# Patient Record
Sex: Male | Born: 2000
Health system: Southern US, Community
[De-identification: ages and names within clinical notes are randomized; demographics above are authoritative.]

---
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
## 2019-06-06 ENCOUNTER — Telehealth: Payer: Self-pay | Admitting: Physician Assistant

## 2019-06-06 ENCOUNTER — Other Ambulatory Visit: Payer: Self-pay

## 2019-06-06 DIAGNOSIS — J069 Acute upper respiratory infection, unspecified: Secondary | ICD-10-CM

## 2019-06-06 DIAGNOSIS — Z20822 Contact with and (suspected) exposure to covid-19: Secondary | ICD-10-CM

## 2019-06-06 MED ORDER — BENZONATATE 100 MG PO CAPS: 100.0000 mg | ORAL_CAPSULE | Freq: Three times a day (TID) | ORAL | 0 refills | Status: DC | PRN

## 2019-06-06 MED ORDER — ALBUTEROL SULFATE HFA 108 (90 BASE) MCG/ACT IN AERS: 2.0000 | INHALATION_SPRAY | Freq: Four times a day (QID) | RESPIRATORY_TRACT | 0 refills | Status: DC | PRN

## 2019-06-06 NOTE — Progress Notes (Signed)
E-Visit for Corona Virus Screening   Your current symptoms could be consistent with the coronavirus.  Many health care providers can now test patients at their office but not all are.  [facility] has multiple testing sites. For information on our COVID testing locations and hours go to [email or web address]  Please quarantine yourself while awaiting your test results.  We are enrolling you in our MyChart Home Mont[facility] for COVID19 . Daily you will receive a questionnaire within the MyChart website. Our COVID 19 response team willl be monitoriing your responses daily. Please continue good preventive care measures, including:  frequent hand-washing, avoid touching your face, cover coughs/sneezes, stay out of crowds and keep a 6 foot distance from others.    COVID-19 is a respiratory illness with symptoms that are similar to the flu. Symptoms are typically mild to moderate, but there have been cases of severe illness and death due to the virus. The following symptoms may appear 2-14 days after exposure: . Fever . Cough . Shortness of breath or difficulty breathing . Chills . Repeated shaking with chills . Muscle pain . Headache . Sore throat . New loss of taste or smell . Fatigue . Congestion or runny nose . Nausea or vomiting . Diarrhea  If you develop fever/cough/breathlessness, please stay home for 10 days with improving symptoms and until you have had 24 hours of no fever (without taking a fever reducer).  Go to the nearest hospital ED for assessment if fever/cough/breathlessness are severe or illness seems like a threat to life.  It is vitally important that if you feel that you have an infection such as this virus or any other virus that you stay home and away from places where you may spread it to others.  You should avoid contact with people age 65 and older.   You should wear a mask or cloth face covering over your nose and mouth if you must be around other  people or animals, including pets (even at home). Try to stay at least 6 feet away from other people. This will protect the people around you.  You can use medication such as A prescription cough medication called Tessalon Perles 100 mg. You may take 1-2 capsules every 8 hours as needed for cough and A prescription inhaler called Albuterol MDI 90 mcg /actuation 2 puffs every 4 hours as needed for shortness of breath, wheezing, cough  You may also take acetaminophen (Tylenol) as needed for fever.   Reduce your risk of any infection by using the same precautions used for avoiding the common cold or flu:  . Wash your hands often with soap and warm water for at least 20 seconds.  If soap and water are not readily available, use an alcohol-based hand sanitizer with at least 60% alcohol.  . If coughing or sneezing, cover your mouth and nose by coughing or sneezing into the elbow areas of your shirt or coat, into a tissue or into your sleeve (not your hands). . Avoid shaking hands with others and consider head nods or verbal greetings only. . Avoid touching your eyes, nose, or mouth with unwashed hands.  . Avoid close contact with people who are sick. . Avoid places or events with large numbers of people in one location, like concerts or sporting events. . Carefully consider travel plans you have or are making. . If you are planning any travel outside or inside the [place], visit the CDC's Travelers' Health webpage for the latest health notices. . If   you have some symptoms but not all symptoms, continue to monitor at home and seek medical attention if your symptoms worsen. . If you are having a medical emergency, call 911.  HOME CARE . Only take medications as instructed by your medical team. . Drink plenty of fluids and get plenty of rest. . A steam or ultrasonic humidifier can help if you have congestion.   GET HELP RIGHT AWAY IF YOU HAVE EMERGENCY WARNING SIGNS** FOR COVID-19. If you or someone is  showing any of these signs seek emergency medical care immediately. Call 911 or proceed to your closest emergency facility if: . You develop worsening high fever. . Trouble breathing . Bluish lips or face . Persistent pain or pressure in the chest . New confusion . Inability to wake or stay awake . You cough up blood. . Your symptoms become more severe  **This list is not all possible symptoms. Contact your medical provider for any symptoms that are sever or concerning to you.   MAKE SURE YOU   Understand these instructions.  Will watch your condition.  Will get help right away if you are not doing well or get worse.  Your e-visit answers were reviewed by a board certified advanced clinical practitioner to complete your personal care plan.  Depending on the condition, your plan could have included both over the counter or prescription medications.  If there is a problem please reply once you have received a response from your provider.  Your safety is important to us.  If you have drug allergies check your prescription carefully.    You can use MyChart to ask questions about today's visit, request a non-urgent call back, or ask for a work or school excuse for 24 hours related to this e-Visit. If it has been greater than 24 hours you will need to follow up with your provider, or enter a new e-Visit to address those concerns. You will get an e-mail in the next two days asking about your experience.  I hope that your e-visit has been valuable and will speed your recovery. Thank you for using e-visits.  Jonathon Bultman PA-C  Approximately 5 minutes was spent documenting and reviewing patient's chart.    

## 2019-06-06 NOTE — Addendum Note (Signed)
Addended by: Terald Sleeper on: 06/06/2019 11:38 AM   Modules accepted: Orders

## 2019-06-08 LAB — NOVEL CORONAVIRUS, NAA: SARS-CoV-2, NAA: NOT DETECTED

## 2019-06-10 ENCOUNTER — Emergency Department (HOSPITAL_COMMUNITY): Payer: 59

## 2019-06-10 ENCOUNTER — Other Ambulatory Visit: Payer: Self-pay

## 2019-06-10 ENCOUNTER — Emergency Department (HOSPITAL_COMMUNITY)
Admission: EM | Admit: 2019-06-10 | Discharge: 2019-06-10 | Disposition: A | Discharge status: Home / Self Care | Payer: 59 | Attending: Emergency Medicine | Admitting: Emergency Medicine

## 2019-06-10 ENCOUNTER — Encounter (HOSPITAL_COMMUNITY): Payer: Self-pay | Admitting: Emergency Medicine

## 2019-06-10 DIAGNOSIS — K529 Noninfective gastroenteritis and colitis, unspecified: Secondary | ICD-10-CM | POA: Insufficient documentation

## 2019-06-10 DIAGNOSIS — R1084 Generalized abdominal pain: Secondary | ICD-10-CM | POA: Diagnosis present

## 2019-06-10 LAB — COMPREHENSIVE METABOLIC PANEL
ALT: 32 U/L (ref 0–44)
AST: 18 U/L (ref 15–41)
Albumin: 4.3 g/dL (ref 3.5–5.0)
Alkaline Phosphatase: 112 U/L (ref 38–126)
Anion gap: 14 (ref 5–15)
BUN: 13 mg/dL (ref 6–20)
CO2: 18 mmol/L — ABNORMAL LOW (ref 22–32)
Calcium: 9.3 mg/dL (ref 8.9–10.3)
Chloride: 105 mmol/L (ref 98–111)
Creatinine, Ser: 1.19 mg/dL (ref 0.61–1.24)
GFR calc Af Amer: >60 mL/min (ref >60)
GFR calc non Af Amer: >60 mL/min (ref >60)
Glucose, Bld: 227 mg/dL — ABNORMAL HIGH (ref 70–99)
Potassium: 3.5 mmol/L (ref 3.5–5.1)
Sodium: 137 mmol/L (ref 135–145)
Total Bilirubin: 1.1 mg/dL (ref 0.3–1.2)
Total Protein: 8 g/dL (ref 6.5–8.1)

## 2019-06-10 LAB — URINALYSIS, ROUTINE W REFLEX MICROSCOPIC
Bilirubin Urine: NEGATIVE
Glucose, UA: 50 mg/dL — AB
Hgb urine dipstick: NEGATIVE
Ketones, ur: NEGATIVE mg/dL
Leukocytes,Ua: NEGATIVE
Nitrite: NEGATIVE
Protein, ur: 100 mg/dL — AB
Specific Gravity, Urine: 1.026 (ref 1.005–1.030)
pH: 5 (ref 5.0–8.0)

## 2019-06-10 LAB — CBC WITH DIFFERENTIAL/PLATELET
Abs Immature Granulocytes: 0.04 10*3/uL (ref 0.00–0.07)
Basophils Absolute: 0.1 10*3/uL (ref 0.0–0.1)
Basophils Relative: 1 %
Eosinophils Absolute: 0.2 10*3/uL (ref 0.0–0.5)
Eosinophils Relative: 3 %
HCT: 54.2 % — ABNORMAL HIGH (ref 39.0–52.0)
Hemoglobin: 18.4 g/dL — ABNORMAL HIGH (ref 13.0–17.0)
Immature Granulocytes: 1 %
Lymphocytes Relative: 20 %
Lymphs Abs: 1.6 10*3/uL (ref 0.7–4.0)
MCH: 30 pg (ref 26.0–34.0)
MCHC: 33.9 g/dL (ref 30.0–36.0)
MCV: 88.4 fL (ref 80.0–100.0)
Monocytes Absolute: 0.6 10*3/uL (ref 0.1–1.0)
Monocytes Relative: 7 %
Neutro Abs: 5.5 10*3/uL (ref 1.7–7.7)
Neutrophils Relative %: 68 %
Platelets: 350 10*3/uL (ref 150–400)
RBC: 6.13 MIL/uL — ABNORMAL HIGH (ref 4.22–5.81)
RDW: 12.9 % (ref 11.5–15.5)
WBC: 7.9 10*3/uL (ref 4.0–10.5)
nRBC: 0 % (ref 0.0–0.2)

## 2019-06-10 LAB — LIPASE, BLOOD: Lipase: 18 U/L (ref 11–51)

## 2019-06-10 MED ORDER — IOHEXOL 300 MG/ML  SOLN
100.0000 mL | Freq: Once | INTRAMUSCULAR | Status: AC | PRN
  Administered 2019-06-10: 100 mL via INTRAVENOUS

## 2019-06-10 MED ORDER — SODIUM CHLORIDE 0.9 % IV BOLUS
1000.0000 mL | Freq: Once | INTRAVENOUS | Status: AC
Start: 2019-06-10 — End: 2019-06-10
  Administered 2019-06-10: 1000 mL via INTRAVENOUS

## 2019-06-10 MED ORDER — SODIUM CHLORIDE 0.9 % IV BOLUS
1000.0000 mL | Freq: Once | INTRAVENOUS | Status: AC
  Administered 2019-06-10: 09:00:00 1000 mL via INTRAVENOUS

## 2019-06-10 MED ORDER — PROMETHAZINE HCL 25 MG PO TABS: 25.0000 mg | ORAL_TABLET | Freq: Four times a day (QID) | ORAL | 0 refills | Status: DC | PRN

## 2019-06-10 MED ORDER — PROMETHAZINE HCL 25 MG/ML IJ SOLN
25.0000 mg | Freq: Once | INTRAMUSCULAR | Status: AC
  Administered 2019-06-10: 25 mg via INTRAVENOUS
  Filled 2019-06-10: qty 1

## 2019-06-10 NOTE — ED Provider Notes (Signed)
[facility] EMERGENCY DEPARTMENT Provider Note   CSN: [id number] Arrival date & time: 06/10/19  0759     History Chief Complaint  Patient presents with  . Abdominal Pain    Jonathon Hughes is a 18 y.o. male.  HPI Patient presents to the emergency department with generalized abdominal pain that started this past Wednesday.  Patient states that it started slowly and he started vomiting with diarrhea.  Patient states that nothing seems to make the condition better or worse.  The patient states he does smoke marijuana on a fairly consistent basis.  The patient states that he did not take any medications prior to arrival for symptoms.  Patient states that he seemed to be doing fairly well until yesterday where he is able to not eat after that point.  Patient states he has had no other upper respiratory type symptoms.  The patient denies chest pain, shortness of breath, headache,blurred vision, neck pain, fever, cough, weakness, numbness, dizziness, anorexia, edema,  rash, back pain, dysuria, hematemesis, bloody stool, near syncope, or syncope.    History reviewed. No pertinent past medical history.  There are no problems to display for this patient.       No family history on file.  Social History   Tobacco Use  . Smoking status: Not on file  Substance Use Topics  . Alcohol use: Not on file  . Drug use: Not on file    Home Medications Prior to Admission medications   Medication Sig Start Date End Date Taking? Authorizing Provider  albuterol (VENTOLIN HFA) 108 (90 Base) MCG/ACT inhaler Inhale 2 puffs into the lungs every 6 (six) hours as needed for wheezing or shortness of breath. 06/06/19   Remus Loffler, PA-C  benzonatate (TESSALON) 100 MG capsule Take 1-2 capsules (100-200 mg total) by mouth 3 (three) times daily as needed for cough. 06/06/19   Remus Loffler, PA-C    Allergies    Patient has no allergy information on record.  Review of Systems    Review of Systems All other systems negative except as documented in the HPI. All pertinent positives and negatives as reviewed in the HPI. Physical Exam Updated Vital Signs BP 112/74   Pulse (!) 106   Temp 98 F (36.7 C) (Oral)   Resp 19   Ht 6' (1.829 m)   SpO2 97%   Physical Exam Vitals and nursing note reviewed.  Constitutional:      General: He is not in acute distress.    Appearance: He is well-developed.  HENT:     Head: Normocephalic and atraumatic.  Eyes:     Pupils: Pupils are equal, round, and reactive to light.  Cardiovascular:     Rate and Rhythm: Normal rate and regular rhythm.     Heart sounds: Normal heart sounds. No murmur. No friction rub. No gallop.   Pulmonary:     Effort: Pulmonary effort is normal. No respiratory distress.     Breath sounds: Normal breath sounds. No wheezing.  Abdominal:     General: Bowel sounds are normal. There is no distension.     Palpations: Abdomen is soft.     Tenderness: There is generalized abdominal tenderness. There is no guarding or rebound.  Musculoskeletal:     Cervical back: Normal range of motion and neck supple.  Skin:    General: Skin is warm and dry.     Capillary Refill: Capillary refill takes less than 2 seconds.  Findings: No erythema or rash.  Neurological:     Mental Status: He is alert and oriented to person, place, and time.     Motor: No abnormal muscle tone.     Coordination: Coordination normal.  Psychiatric:        Behavior: Behavior normal.     ED Results / Procedures / Treatments   Labs (all labs ordered are listed, but only abnormal results are displayed) Labs Reviewed  COMPREHENSIVE METABOLIC PANEL - Abnormal; Notable for the following components:      Result Value   CO2 18 (*)    Glucose, Bld 227 (*)    All other components within normal limits  CBC WITH DIFFERENTIAL/PLATELET - Abnormal; Notable for the following components:   RBC 6.13 (*)    Hemoglobin 18.4 (*)    HCT 54.2 (*)     All other components within normal limits  LIPASE, BLOOD  URINALYSIS, ROUTINE W REFLEX MICROSCOPIC    EKG None  Radiology No results found.  Procedures Procedures (including critical care time)  Medications Ordered in ED Medications  sodium chloride 0.9 % bolus 1,000 mL (1,000 mLs Intravenous New Bag/Given 06/10/19 0848)  promethazine (PHENERGAN) injection 25 mg (25 mg Intravenous Given 06/10/19 0850)    ED Course  I have reviewed the triage vital signs and the nursing notes.  Pertinent labs & imaging results that were available during my care of the patient were reviewed by me and considered in my medical decision making (see chart for details).    MDM Rules/Calculators/A&P     CHA2DS2/VAS Stroke Risk Points      N/A >= 2 Points: High Risk  1 - 1.99 Points: Medium Risk  0 Points: Low Risk    A final score could not be computed because of missing components.: Last  Change: N/A     This score determines the patient's risk of having a stroke if the  patient has atrial fibrillation.      This score is not applicable to this patient. Components are not  calculated.                  I have advised the patient of her plan to obtain laboratory testing, give him IV fluids, and a CT scan of his abdomen pelvis.  Advised the patient that it seems likely that this is just a gastroenteritis.  Patient CT scan does not yield any significant abnormalities.  Patient could have this as a result of his marijuana smoking.  I did advise him that marijuana smoking in excess can cause similar symptoms.  Patient will be discharged home with antiemetics.  Patient's blood sugar was elevated.  Patient is advised on need to follow-up with a primary care doctor and advised to return here as needed. Final Clinical Impression(s) / ED Diagnoses Final diagnoses:  None    Rx / DC Orders ED Discharge Orders    None       Dalia Heading, PA-C 06/10/19 1449    Charlesetta Shanks, MD 06/11/19  1049

## 2019-06-10 NOTE — Discharge Instructions (Signed)
Follow-up with your primary doctor.  Your CT scan did not show any significant abnormality.  You may want to curtail some of your marijuana use as this can lead to nausea vomiting and diarrhea as well if used excessively.  Slowly increase your fluid intake.

## 2019-06-10 NOTE — ED Triage Notes (Addendum)
-  Pt here with reports of generalized abdominal pain with nausea and vomiting since Wednesday -vomited about 4 times in the 24hours -was able to eat soups up until yesterday; he has not been able to eat -denies any home remedies -endorse the use of marijuana (last use yesterday) -denies fever  Addendum:  -Also reports having diarrhea  -nobody else in his home has these sympotoms

## 2021-06-03 ENCOUNTER — Ambulatory Visit (HOSPITAL_COMMUNITY)
Admission: EM | Admit: 2021-06-03 | Discharge: 2021-06-03 | Disposition: A | Discharge status: Home / Self Care | Payer: BC Managed Care – PPO | Attending: Physician Assistant | Admitting: Physician Assistant

## 2021-06-03 ENCOUNTER — Encounter (HOSPITAL_COMMUNITY): Payer: Self-pay

## 2021-06-03 ENCOUNTER — Other Ambulatory Visit: Payer: Self-pay

## 2021-06-03 DIAGNOSIS — J101 Influenza due to other identified influenza virus with other respiratory manifestations: Secondary | ICD-10-CM | POA: Diagnosis not present

## 2021-06-03 DIAGNOSIS — R112 Nausea with vomiting, unspecified: Secondary | ICD-10-CM

## 2021-06-03 DIAGNOSIS — R051 Acute cough: Secondary | ICD-10-CM

## 2021-06-03 LAB — POC INFLUENZA A AND B ANTIGEN (URGENT CARE ONLY)
INFLUENZA A ANTIGEN, POC: POSITIVE — AB
INFLUENZA B ANTIGEN, POC: NEGATIVE

## 2021-06-03 MED ORDER — PROMETHAZINE-DM 6.25-15 MG/5ML PO SYRP: 5.0000 mL | ORAL_SOLUTION | Freq: Three times a day (TID) | ORAL | 0 refills | Status: DC | PRN

## 2021-06-03 MED ORDER — ONDANSETRON 4 MG PO TBDP: 4.0000 mg | ORAL_TABLET | Freq: Three times a day (TID) | ORAL | 0 refills | Status: DC | PRN

## 2021-06-03 MED ORDER — OSELTAMIVIR PHOSPHATE 75 MG PO CAPS: 75.0000 mg | ORAL_CAPSULE | Freq: Two times a day (BID) | ORAL | 0 refills | Status: DC

## 2021-06-03 MED ORDER — ONDANSETRON 4 MG PO TBDP
ORAL_TABLET | ORAL | Status: AC
  Filled 2021-06-03: qty 1

## 2021-06-03 MED ORDER — ONDANSETRON 4 MG PO TBDP
4.0000 mg | ORAL_TABLET | Freq: Once | ORAL | Status: AC
Start: 2021-06-03 — End: 2021-06-03
  Administered 2021-06-03: 4 mg via ORAL

## 2021-06-03 NOTE — ED Provider Notes (Signed)
[facility]    CSN: [id number] Arrival date & time: 06/03/21  5053      History   Chief Complaint Chief Complaint  Patient presents with   Cough    HPI Jonathon Hughes is a 20 y.o. male.   Patient presents today with a 2-day history of URI symptoms.  Reports cough, nasal congestion, nausea, vomiting, fatigue, malaise, body aches.  Denies any chest pain, shortness of breath, abdominal pain, diarrhea.  He has tried Tylenol and NyQuil without improvement of symptoms.  Denies any known sick contacts.  He has not had influenza or COVID-19 vaccine.  He has had COVID approximately 1 year ago.  Denies any significant past medical history other than childhood asthma; has not required albuterol inhaler in adulthood.  Denies any recent antibiotics.  He is having difficulty with daily activities as result of symptoms.   History reviewed. No pertinent past medical history.  There are no problems to display for this patient.   History reviewed. No pertinent surgical history.     Home Medications    Prior to Admission medications   Medication Sig Start Date End Date Taking? Authorizing Provider  ondansetron (ZOFRAN-ODT) 4 MG disintegrating tablet Take 1 tablet (4 mg total) by mouth every 8 (eight) hours as needed for nausea or vomiting. 06/03/21  Yes Destenee Guerry K, PA-C  albuterol (VENTOLIN HFA) 108 (90 Base) MCG/ACT inhaler Inhale 2 puffs into the lungs every 6 (six) hours as needed for wheezing or shortness of breath. 06/06/19   Remus Loffler, PA-C  benzonatate (TESSALON) 100 MG capsule Take 1-2 capsules (100-200 mg total) by mouth 3 (three) times daily as needed for cough. 06/06/19   Remus Loffler, PA-C  promethazine (PHENERGAN) 25 MG tablet Take 1 tablet (25 mg total) by mouth every 6 (six) hours as needed for nausea or vomiting. 06/10/19   Charlestine Night, PA-C    Family History History reviewed. No pertinent family history.  Social History Social History    Tobacco Use   Smoking status: Unknown     Allergies   Patient has no allergy information on record.   Review of Systems Review of Systems  Constitutional:  Positive for activity change and fatigue. Negative for appetite change and fever.  HENT:  Positive for congestion and sore throat. Negative for sinus pressure and sneezing.   Respiratory:  Positive for cough. Negative for shortness of breath.   Cardiovascular:  Negative for chest pain.  Gastrointestinal:  Positive for nausea and vomiting. Negative for abdominal pain and diarrhea.  Musculoskeletal:  Positive for arthralgias and myalgias.  Neurological:  Positive for headaches. Negative for dizziness and light-headedness.    Physical Exam Triage Vital Signs ED Triage Vitals  Enc Vitals Group     BP 06/03/21 0839 138/80     Pulse Rate 06/03/21 0839 97     Resp 06/03/21 0839 16     Temp 06/03/21 0839 98.5 F (36.9 C)     Temp Source 06/03/21 0839 Oral     SpO2 06/03/21 0839 100 %     Weight --      Height --      Head Circumference --      Peak Flow --      Pain Score 06/03/21 0840 4     Pain Loc --      Pain Edu? --      Excl. in GC? --    No data found.  Updated Vital Signs BP  138/80 (BP Location: Left Arm)   Pulse 97   Temp 98.5 F (36.9 C) (Oral)   Resp 16   SpO2 100%   Visual Acuity Right Eye Distance:   Left Eye Distance:   Bilateral Distance:    Right Eye Near:   Left Eye Near:    Bilateral Near:     Physical Exam Vitals reviewed.  Constitutional:      General: He is awake.     Appearance: Normal appearance. He is well-developed. He is not ill-appearing.     Comments: Very pleasant male appears stated age in no acute distress sitting comfortably in exam room  HENT:     Head: Normocephalic and atraumatic.     Right Ear: Tympanic membrane, ear canal and external ear normal. Tympanic membrane is not erythematous or bulging.     Left Ear: Tympanic membrane, ear canal and external ear normal.  Tympanic membrane is not erythematous or bulging.     Nose: Nose normal.     Mouth/Throat:     Pharynx: Uvula midline. No oropharyngeal exudate or posterior oropharyngeal erythema.  Cardiovascular:     Rate and Rhythm: Normal rate and regular rhythm.     Heart sounds: Normal heart sounds, S1 normal and S2 normal. No murmur heard. Pulmonary:     Effort: Pulmonary effort is normal. No accessory muscle usage or respiratory distress.     Breath sounds: Normal breath sounds. No stridor. No wheezing, rhonchi or rales.     Comments: Clear to auscultation bilaterally Abdominal:     Palpations: Abdomen is soft.     Tenderness: There is no abdominal tenderness.  Neurological:     Mental Status: He is alert.  Psychiatric:        Behavior: Behavior is cooperative.     UC Treatments / Results  Labs (all labs ordered are listed, but only abnormal results are displayed) Labs Reviewed  POC INFLUENZA A AND B ANTIGEN (URGENT CARE ONLY)    EKG   Radiology No results found.  Procedures Procedures (including critical care time)  Medications Ordered in UC Medications  ondansetron (ZOFRAN-ODT) disintegrating tablet 4 mg (has no administration in time range)    Initial Impression / Assessment and Plan / UC Course  I have reviewed the triage vital signs and the nursing notes.  Pertinent labs & imaging results that were available during my care of the patient were reviewed by me and considered in my medical decision making (see chart for details).     Patient is positive for influenza A.  He was within 72 hours of symptom onset so we will start Tamiflu.  He was given Zofran in clinic with improvement of nausea and vomiting symptoms.  Prescription was sent home with him to be used up to 3 times a day as needed with instruction to push fluids.  He was given Promethazine DM for cough with instruction of drive drink alcohol taking this medication as drowsiness is a common side effect.  He can use  over-the-counter medications including Tylenol ibuprofen for fever and pain, Mucinex and Flonase for cough and congestion.  Discussed alarm symptoms that warrant emergent evaluation.  He was provided work excuse note.  Strict return precautions given to which he expressed understanding  Final Clinical Impressions(s) / UC Diagnoses   Final diagnoses:  None   Discharge Instructions   None    ED Prescriptions     Medication Sig Dispense Auth. Provider   ondansetron (ZOFRAN-ODT) 4 MG disintegrating  tablet Take 1 tablet (4 mg total) by mouth every 8 (eight) hours as needed for nausea or vomiting. 20 tablet Satrina Magallanes, Noberto Retort, PA-C      PDMP not reviewed this encounter.   Jeani Hawking, PA-C 06/03/21 1012

## 2021-06-03 NOTE — ED Triage Notes (Signed)
Pt presents to the office today for headache, body aches and cough x 2-3 days.

## 2021-06-03 NOTE — Discharge Instructions (Signed)
He does have positive for influenza.  Please start Tamiflu twice daily.  I have called in Zofran to be used for nausea and vomiting.  Make sure you are drinking plenty of fluid.  Use Promethazine DM for cough.  This can make you sleepy do not drive or drink alcohol while taking it.  You can use Tylenol and ibuprofen for additional symptom relief.  If anything worsens please return for reevaluation.  You can return to work once you have been fever free without medication use for 24 hours.

## 2022-07-06 DIAGNOSIS — R519 Headache, unspecified: Secondary | ICD-10-CM | POA: Diagnosis not present

## 2022-07-06 DIAGNOSIS — J020 Streptococcal pharyngitis: Secondary | ICD-10-CM | POA: Diagnosis not present

## 2022-12-26 DIAGNOSIS — M25552 Pain in left hip: Secondary | ICD-10-CM | POA: Diagnosis not present

## 2022-12-26 DIAGNOSIS — S99912A Unspecified injury of left ankle, initial encounter: Secondary | ICD-10-CM | POA: Diagnosis not present

## 2022-12-26 DIAGNOSIS — S3992XA Unspecified injury of lower back, initial encounter: Secondary | ICD-10-CM | POA: Diagnosis not present

## 2022-12-26 DIAGNOSIS — S199XXA Unspecified injury of neck, initial encounter: Secondary | ICD-10-CM | POA: Diagnosis not present

## 2022-12-26 DIAGNOSIS — S27331A Laceration of lung, unilateral, initial encounter: Secondary | ICD-10-CM | POA: Diagnosis not present

## 2022-12-26 DIAGNOSIS — S27321A Contusion of lung, unilateral, initial encounter: Secondary | ICD-10-CM | POA: Diagnosis not present

## 2022-12-26 DIAGNOSIS — S51811A Laceration without foreign body of right forearm, initial encounter: Secondary | ICD-10-CM | POA: Diagnosis not present

## 2022-12-26 DIAGNOSIS — S0990XA Unspecified injury of head, initial encounter: Secondary | ICD-10-CM | POA: Diagnosis not present

## 2022-12-26 DIAGNOSIS — S299XXA Unspecified injury of thorax, initial encounter: Secondary | ICD-10-CM | POA: Diagnosis not present

## 2022-12-26 DIAGNOSIS — M25562 Pain in left knee: Secondary | ICD-10-CM | POA: Diagnosis not present

## 2022-12-26 DIAGNOSIS — S270XXA Traumatic pneumothorax, initial encounter: Secondary | ICD-10-CM | POA: Diagnosis not present

## 2022-12-26 DIAGNOSIS — Y33XXXA Other specified events, undetermined intent, initial encounter: Secondary | ICD-10-CM | POA: Diagnosis not present

## 2022-12-26 DIAGNOSIS — S4991XA Unspecified injury of right shoulder and upper arm, initial encounter: Secondary | ICD-10-CM | POA: Diagnosis not present

## 2022-12-26 DIAGNOSIS — M79671 Pain in right foot: Secondary | ICD-10-CM | POA: Diagnosis not present

## 2022-12-26 DIAGNOSIS — S3993XA Unspecified injury of pelvis, initial encounter: Secondary | ICD-10-CM | POA: Diagnosis not present

## 2022-12-26 DIAGNOSIS — S0181XA Laceration without foreign body of other part of head, initial encounter: Secondary | ICD-10-CM | POA: Diagnosis not present

## 2022-12-26 DIAGNOSIS — S50811A Abrasion of right forearm, initial encounter: Secondary | ICD-10-CM | POA: Diagnosis not present

## 2022-12-26 DIAGNOSIS — S99922A Unspecified injury of left foot, initial encounter: Secondary | ICD-10-CM | POA: Diagnosis not present

## 2022-12-26 DIAGNOSIS — S3991XA Unspecified injury of abdomen, initial encounter: Secondary | ICD-10-CM | POA: Diagnosis not present

## 2022-12-26 DIAGNOSIS — S41111A Laceration without foreign body of right upper arm, initial encounter: Secondary | ICD-10-CM | POA: Diagnosis not present

## 2022-12-26 DIAGNOSIS — S93622A Sprain of tarsometatarsal ligament of left foot, initial encounter: Secondary | ICD-10-CM | POA: Diagnosis not present

## 2022-12-26 DIAGNOSIS — S59911A Unspecified injury of right forearm, initial encounter: Secondary | ICD-10-CM | POA: Diagnosis not present

## 2022-12-26 DIAGNOSIS — S0003XA Contusion of scalp, initial encounter: Secondary | ICD-10-CM | POA: Diagnosis not present

## 2023-01-05 ENCOUNTER — Encounter: Payer: Self-pay | Admitting: Family Medicine

## 2023-01-05 ENCOUNTER — Ambulatory Visit (INDEPENDENT_AMBULATORY_CARE_PROVIDER_SITE_OTHER): Payer: BC Managed Care – PPO | Admitting: Family Medicine

## 2023-01-05 VITALS — BP 106/67 | HR 76 | Temp 98.1°F | Resp 18 | Ht 74.0 in | Wt 185.8 lb

## 2023-01-05 DIAGNOSIS — M25551 Pain in right hip: Secondary | ICD-10-CM | POA: Diagnosis not present

## 2023-01-05 DIAGNOSIS — M25552 Pain in left hip: Secondary | ICD-10-CM

## 2023-01-05 DIAGNOSIS — S270XXS Traumatic pneumothorax, sequela: Secondary | ICD-10-CM | POA: Diagnosis not present

## 2023-01-05 DIAGNOSIS — M25562 Pain in left knee: Secondary | ICD-10-CM | POA: Diagnosis not present

## 2023-01-05 DIAGNOSIS — S93622S Sprain of tarsometatarsal ligament of left foot, sequela: Secondary | ICD-10-CM | POA: Diagnosis not present

## 2023-01-05 DIAGNOSIS — Z7689 Persons encountering health services in other specified circumstances: Secondary | ICD-10-CM
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
## 2023-01-31 ENCOUNTER — Ambulatory Visit (INDEPENDENT_AMBULATORY_CARE_PROVIDER_SITE_OTHER): Payer: BC Managed Care – PPO | Admitting: Family Medicine

## 2023-01-31 VITALS — BP 129/69 | HR 68 | Temp 98.0°F | Resp 18 | Ht 74.0 in | Wt 193.7 lb

## 2023-01-31 DIAGNOSIS — S93622S Sprain of tarsometatarsal ligament of left foot, sequela: Secondary | ICD-10-CM

## 2023-01-31 DIAGNOSIS — S93622A Sprain of tarsometatarsal ligament of left foot, initial encounter: Secondary | ICD-10-CM | POA: Insufficient documentation

## 2023-01-31 DIAGNOSIS — S270XXA Traumatic pneumothorax, initial encounter: Secondary | ICD-10-CM | POA: Insufficient documentation

## 2023-01-31 DIAGNOSIS — S93622D Sprain of tarsometatarsal ligament of left foot, subsequent encounter: Secondary | ICD-10-CM

## 2023-01-31 MED ORDER — MELOXICAM 7.5 MG PO TABS
7.5000 mg | ORAL_TABLET | Freq: Every day | ORAL | 0 refills | Status: AC
Start: 2023-01-31 — End: ?
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
## 2023-02-01 ENCOUNTER — Ambulatory Visit (INDEPENDENT_AMBULATORY_CARE_PROVIDER_SITE_OTHER): Payer: BC Managed Care – PPO | Admitting: Orthopaedic Surgery

## 2023-02-01 ENCOUNTER — Ambulatory Visit (INDEPENDENT_AMBULATORY_CARE_PROVIDER_SITE_OTHER): Payer: BC Managed Care – PPO

## 2023-02-01 DIAGNOSIS — S93622A Sprain of tarsometatarsal ligament of left foot, initial encounter: Secondary | ICD-10-CM | POA: Diagnosis not present

## 2023-02-01 DIAGNOSIS — M79672 Pain in left foot: Secondary | ICD-10-CM | POA: Diagnosis not present

## 2023-02-01 NOTE — Progress Notes (Signed)
Chief Complaint: Left foot pain     History of Present Illness:    Jonathon Hughes is a 22 y.o. male presents today with ongoing left foot pain for the last several months.  He has not had any recent injuries.  He is here today for further discussion as he continues to experience pain about the second and third metatarsal.    Surgical History:   None  PMH/PSH/Family History/Social History/Meds/Allergies:   No past medical history on file. No past surgical history on file. Social History   Socioeconomic History   Marital status: Single    Spouse name: Not on file   Number of children: 0   Years of education: Not on file   Highest education level: GED or equivalent  Occupational History   Occupation: [employer] examiner  Tobacco Use   Smoking status: Never    Passive exposure: Never   Smokeless tobacco: Never  Vaping Use   Vaping status: Every Day  Substance and Sexual Activity   Alcohol use: Yes    Comment: occ   Drug use: Yes    Types: Marijuana   Sexual activity: Yes    Partners: Female    Birth control/protection: None  Other Topics Concern   Not on file  Social History Narrative   Not on file   Social Determinants of Health   Financial Resource Strain: Low Risk  (01/31/2023)   Overall Financial Resource Strain (CARDIA)    Difficulty of Paying Living Expenses: Not very hard  Recent Concern: Financial Resource Strain - High Risk (12/27/2022)   Received from [facility], [facility]   Overall Financial Resource Strain (CARDIA)    Difficulty of Paying Living Expenses: Very hard  Food Insecurity: No Food Insecurity (01/31/2023)   Hunger Vital Sign    Worried About Running Out of Food in the Last Year: Never true    Ran Out of Food in the Last Year: Never true  Transportation Needs: No Transportation Needs (01/31/2023)   PRAPARE - Administrator, Civil Service (Medical): No    Lack of  Transportation (Non-Medical): No  Physical Activity: Sufficiently Active (01/31/2023)   Exercise Vital Sign    Days of Exercise per Week: 5 days    Minutes of Exercise per Session: 60 min  Stress: No Stress Concern Present (01/31/2023)   [employer] of Occupational Health - Occupational Stress Questionnaire    Feeling of Stress : Not at all  Social Connections: Socially Isolated (01/31/2023)   Social Connection and Isolation Panel [NHANES]    Frequency of Communication with Friends and Family: More than three times a week    Frequency of Social Gatherings with Friends and Family: Once a week    Attends Religious Services: Never    Database administrator or Organizations: No    Attends Engineer, structural: Not on file    Marital Status: Never married   Family History  Problem Relation Age of Onset   Diabetes Mother    Heart disease Father    No Known Allergies Current Outpatient Medications  Medication Sig Dispense Refill   meloxicam (MOBIC) 7.5 MG tablet Take 1 tablet (7.5 mg total) by mouth daily. 30 tablet 0   No current facility-administered medications for this visit.  No results found.  Review of Systems:   A ROS was performed including pertinent positives and negatives as documented in the HPI.  Physical Exam :   Constitutional: NAD and appears stated age Neurological: Alert and oriented Psych: Appropriate affect and cooperative There were no vitals taken for this visit.   Comprehensive Musculoskeletal Exam:    Tenderness palpation about the second and third metatarsal.  There is a small bunion deformity.  Hammertoes of the second and third toe  Imaging:   Xray (3 views left foot): Small bunion deformity with second and third hammertoes    I personally reviewed and interpreted the radiographs.   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  At today's visit I did initially recommend a  trial of an orthotic as well as a wide toebox shoe footwear modification given his very young age.  He would like to proceed with this initially.  I will plan to see him back as needed.  Plan :    -Return to clinic as needed     I personally saw and evaluated the patient, and participated in the management and treatment plan.  Huel Cote, MD Attending Physician, Orthopedic Surgery  This document was dictated using Dragon voice recognition software. A reasonable attempt at proof reading has been made to minimize errors.

## 2023-02-02 ENCOUNTER — Telehealth (HOSPITAL_BASED_OUTPATIENT_CLINIC_OR_DEPARTMENT_OTHER): Payer: Self-pay | Admitting: Orthopaedic Surgery

## 2023-02-02 ENCOUNTER — Encounter (HOSPITAL_BASED_OUTPATIENT_CLINIC_OR_DEPARTMENT_OTHER): Payer: Self-pay | Admitting: Orthopaedic Surgery

## 2023-02-02 NOTE — Telephone Encounter (Signed)
Note printed and ready for pick up

## 2023-02-02 NOTE — Telephone Encounter (Signed)
Patient needs a work note that says he can go back to work full time

## 2023-08-29 ENCOUNTER — Ambulatory Visit
Admission: EM | Admit: 2023-08-29 | Discharge: 2023-08-29 | Disposition: A | Discharge status: Home / Self Care | Attending: Family Medicine | Admitting: Family Medicine

## 2023-08-29 DIAGNOSIS — Z113 Encounter for screening for infections with a predominantly sexual mode of transmission: Secondary | ICD-10-CM

## 2023-08-29 DIAGNOSIS — Z202 Contact with and (suspected) exposure to infections with a predominantly sexual mode of transmission: Secondary | ICD-10-CM

## 2023-08-29 DIAGNOSIS — J069 Acute upper respiratory infection, unspecified: Secondary | ICD-10-CM | POA: Diagnosis not present

## 2023-08-29 MED ORDER — BENZONATATE 200 MG PO CAPS
200.0000 mg | ORAL_CAPSULE | Freq: Three times a day (TID) | ORAL | 0 refills | Status: AC | PRN
Start: 2023-08-29 — End: ?

## 2023-08-29 MED ORDER — METRONIDAZOLE 500 MG PO TABS
500.0000 mg | ORAL_TABLET | Freq: Two times a day (BID) | ORAL | 0 refills | Status: AC
Start: 2023-08-29 — End: ?

## 2023-08-29 NOTE — ED Triage Notes (Signed)
 Pt presents with sore throat and is requesting STD testing. Pt states he does not have sxs. Reports he has been exposed to a recent partner that has Trich.
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
## 2023-08-29 NOTE — Discharge Instructions (Signed)
 The clinic will contact you with the results of the testing done today if positive.  Start metronidazole for your exposure to trichomonas twice daily for 7 days.  No alcohol while you are on this medication.  You may take Tessalon as needed for your cough.  Lots of rest and fluids.  Please follow-up with your PCP if your symptoms do not improve.  Please go to the ER for any worsening symptoms.  Hope you feel better soon!

## 2023-08-30 LAB — CYTOLOGY, (ORAL, ANAL, URETHRAL) ANCILLARY ONLY
Chlamydia: NEGATIVE
Comment: NEGATIVE
Comment: NEGATIVE
Comment: NORMAL
Neisseria Gonorrhea: NEGATIVE
Trichomonas: NEGATIVE
